# Patient Record
Sex: Male | Born: 1960 | Race: White | Hispanic: No | Marital: Married
Health system: Southern US, Community
[De-identification: ages and names within clinical notes are randomized; demographics above are authoritative.]

## PROBLEM LIST (undated history)

## (undated) DIAGNOSIS — I2729 Other secondary pulmonary hypertension: Secondary | ICD-10-CM

## (undated) DIAGNOSIS — R931 Abnormal findings on diagnostic imaging of heart and coronary circulation: Secondary | ICD-10-CM

## (undated) DIAGNOSIS — G5600 Carpal tunnel syndrome, unspecified upper limb: Secondary | ICD-10-CM

## (undated) DIAGNOSIS — I10 Essential (primary) hypertension: Secondary | ICD-10-CM

## (undated) DIAGNOSIS — D1803 Hemangioma of intra-abdominal structures: Secondary | ICD-10-CM

## (undated) DIAGNOSIS — M349 Systemic sclerosis, unspecified: Secondary | ICD-10-CM

## (undated) DIAGNOSIS — N200 Calculus of kidney: Secondary | ICD-10-CM

## (undated) DIAGNOSIS — I7300 Raynaud's syndrome without gangrene: Secondary | ICD-10-CM

## (undated) DIAGNOSIS — R911 Solitary pulmonary nodule: Secondary | ICD-10-CM

## (undated) DIAGNOSIS — K219 Gastro-esophageal reflux disease without esophagitis: Secondary | ICD-10-CM

## (undated) HISTORY — DX: Carpal tunnel syndrome, unspecified upper limb: G56.00

## (undated) HISTORY — DX: Calculus of kidney: N20.0

## (undated) HISTORY — DX: Gastro-esophageal reflux disease without esophagitis: K21.9

## (undated) HISTORY — DX: Abnormal findings on diagnostic imaging of heart and coronary circulation: R93.1

## (undated) HISTORY — DX: Hemangioma of intra-abdominal structures: D18.03

## (undated) HISTORY — DX: Essential (primary) hypertension: I10

## (undated) HISTORY — DX: Systemic sclerosis, unspecified: M34.9

## (undated) HISTORY — DX: Other secondary pulmonary hypertension: I27.29

## (undated) HISTORY — PX: APPENDECTOMY: SHX54

## (undated) HISTORY — DX: Raynaud's syndrome without gangrene: I73.00

## (undated) HISTORY — DX: Solitary pulmonary nodule: R91.1

---

## 1998-06-29 ENCOUNTER — Ambulatory Visit (HOSPITAL_BASED_OUTPATIENT_CLINIC_OR_DEPARTMENT_OTHER): Admission: RE | Admit: 1998-06-29 | Discharge: 1998-06-29 | Payer: Self-pay | Admitting: *Deleted

## 1999-06-22 ENCOUNTER — Ambulatory Visit (HOSPITAL_COMMUNITY): Admission: RE | Admit: 1999-06-22 | Discharge: 1999-06-22 | Payer: Self-pay | Admitting: Urology

## 1999-06-22 ENCOUNTER — Encounter: Payer: Self-pay | Admitting: Urology

## 2001-06-30 ENCOUNTER — Ambulatory Visit (HOSPITAL_COMMUNITY): Admission: RE | Admit: 2001-06-30 | Discharge: 2001-06-30 | Payer: Self-pay | Admitting: Rheumatology

## 2002-02-10 ENCOUNTER — Ambulatory Visit (HOSPITAL_COMMUNITY): Admission: RE | Admit: 2002-02-10 | Discharge: 2002-02-10 | Payer: Self-pay | Admitting: Rheumatology

## 2002-03-27 ENCOUNTER — Ambulatory Visit (HOSPITAL_COMMUNITY): Admission: RE | Admit: 2002-03-27 | Discharge: 2002-03-27 | Payer: Self-pay | Admitting: Rheumatology

## 2002-08-14 ENCOUNTER — Ambulatory Visit (HOSPITAL_COMMUNITY): Admission: RE | Admit: 2002-08-14 | Discharge: 2002-08-14 | Payer: Self-pay | Admitting: Rheumatology

## 2003-05-03 ENCOUNTER — Ambulatory Visit (HOSPITAL_COMMUNITY): Admission: RE | Admit: 2003-05-03 | Discharge: 2003-05-03 | Payer: Self-pay | Admitting: Rheumatology

## 2004-05-29 ENCOUNTER — Ambulatory Visit (HOSPITAL_COMMUNITY): Admission: RE | Admit: 2004-05-29 | Discharge: 2004-05-29 | Payer: Self-pay | Admitting: Rheumatology

## 2005-01-22 ENCOUNTER — Ambulatory Visit (HOSPITAL_COMMUNITY): Admission: RE | Admit: 2005-01-22 | Discharge: 2005-01-22 | Payer: Self-pay | Admitting: Internal Medicine

## 2005-02-02 ENCOUNTER — Ambulatory Visit (HOSPITAL_COMMUNITY): Admission: RE | Admit: 2005-02-02 | Discharge: 2005-02-02 | Payer: Self-pay | Admitting: Internal Medicine

## 2005-08-20 ENCOUNTER — Ambulatory Visit (HOSPITAL_COMMUNITY): Admission: RE | Admit: 2005-08-20 | Discharge: 2005-08-20 | Payer: Self-pay | Admitting: Rheumatology

## 2006-02-20 ENCOUNTER — Encounter: Admission: RE | Admit: 2006-02-20 | Discharge: 2006-02-20 | Payer: Self-pay | Admitting: Internal Medicine

## 2006-08-31 IMAGING — CT CT ANGIO CHEST
4 of 9 series · 17 of 31 positions shown · IV contrast (120 ML OMNI 300)
Comparison: None.

CLINICAL DATA: Evaluate for aortic aneurysm. 
 CT ANGIOGRAPHY OF CHEST:
TECHNIQUE: Multidetector CT imaging of the chest was performed during bolus injection of intravenous contrast.  Multiplanar CT angiographic image reconstructions were generated to evaluate the vascular anatomy.
 Contrast:  120 cc Omnipaque 300.

[Series 4: aortic disection · axial · 0.70mm/px · z∈[-270,-162]mm · 3 of 129 slices shown (1 of 2)]
[im 43/129  lung]
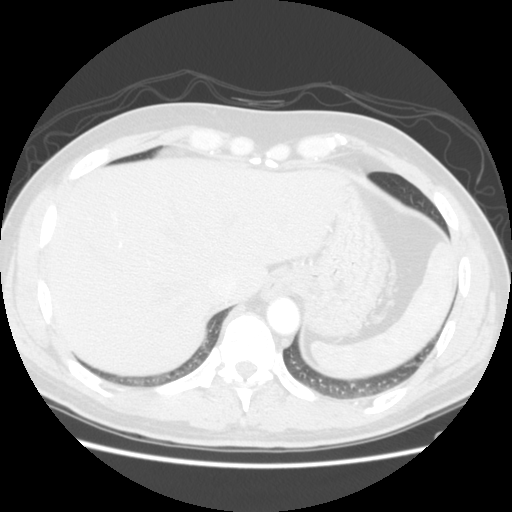
[im 65/129  lung]
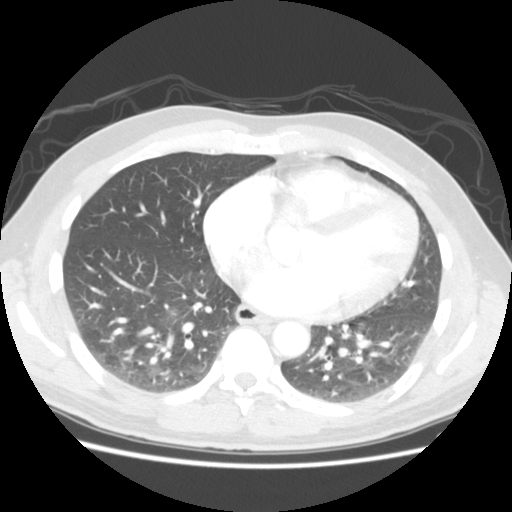
[im 86/129  lung]
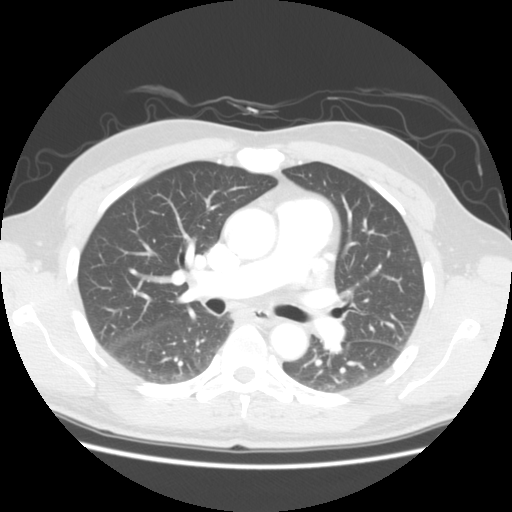

[Series 104: aortic disection · axial · 0.70mm/px · z∈[-336,-96]mm · 8 of 258 slices shown (2 of 2)]
[im 33/258  lung]
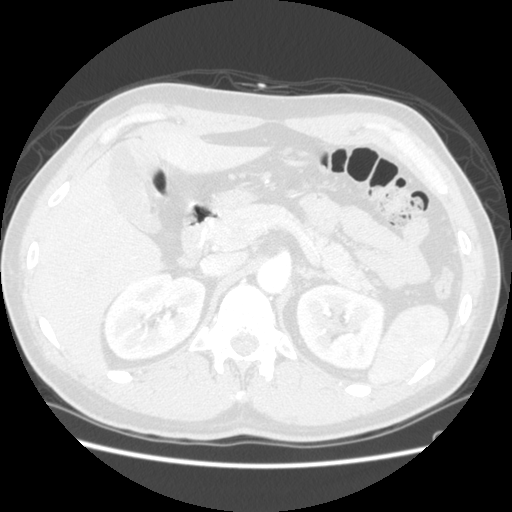
[im 65/258  mediastinal]
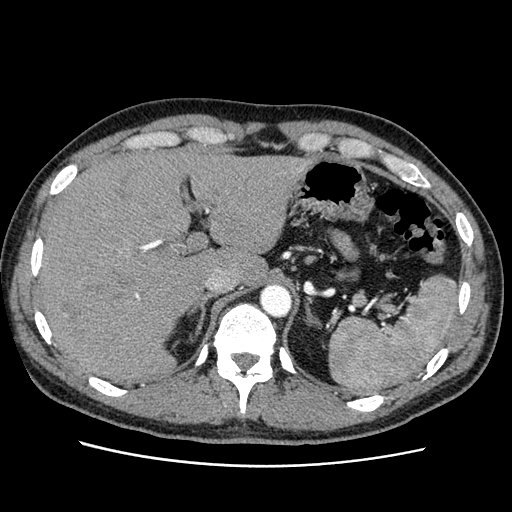
[im 97/258  lung]
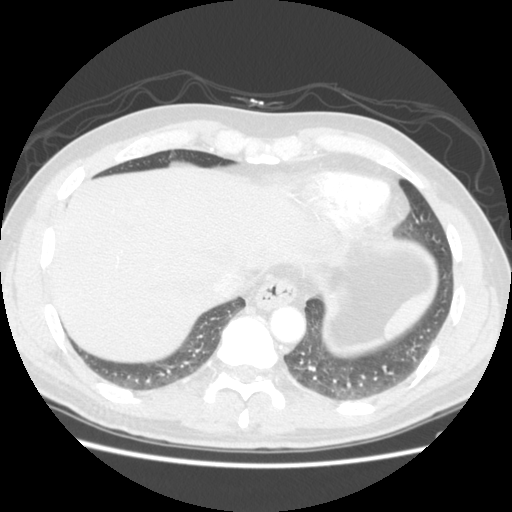
[im 129/258  mediastinal]
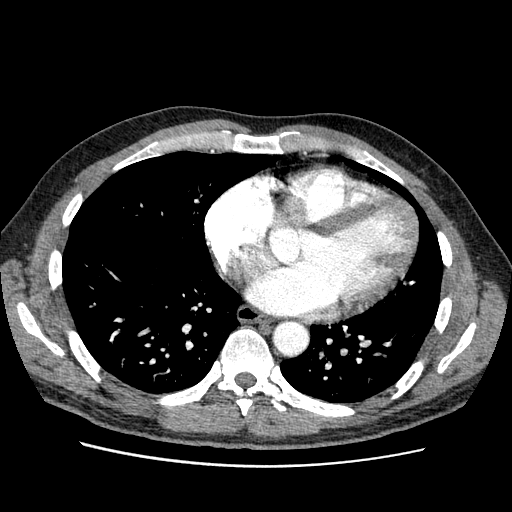
[im 130/258  lung]
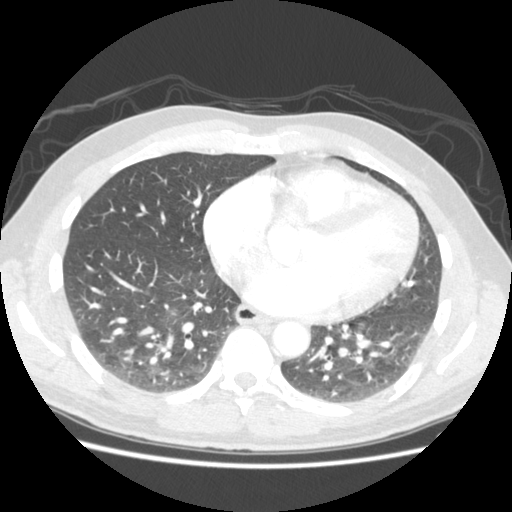
[im 161/258  mediastinal]
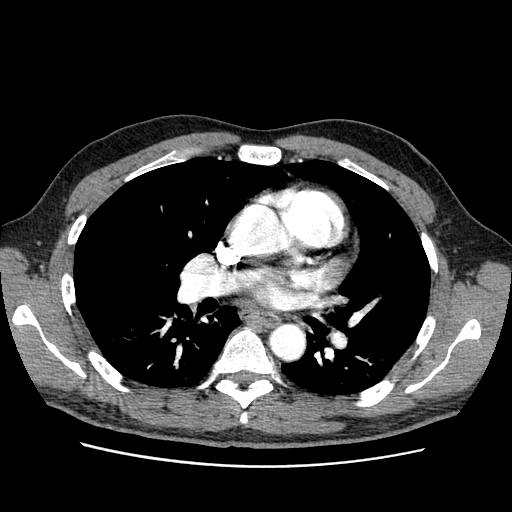
[im 193/258  lung]
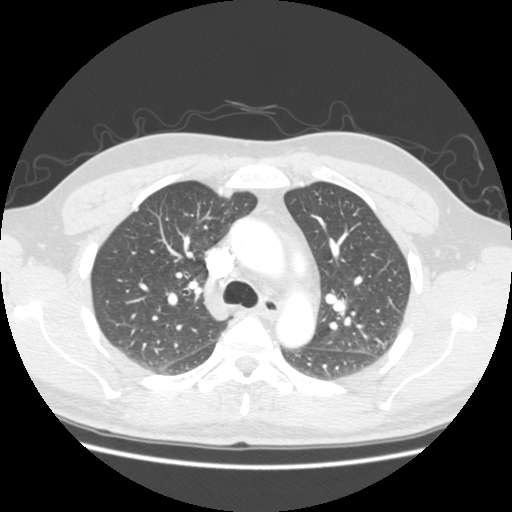
[im 225/258  mediastinal]
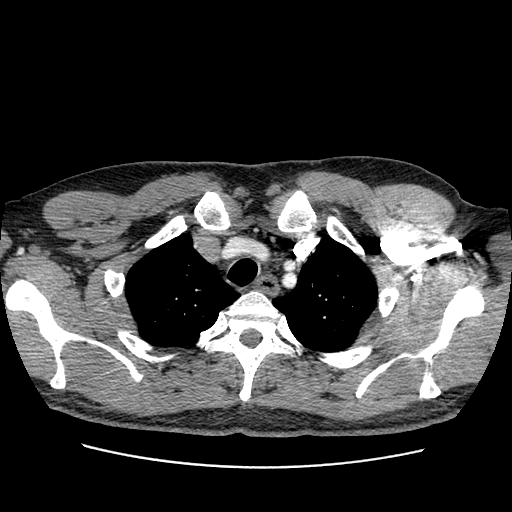

[Series 105: reformatted · sagittal · 0.70mm/px · 4 of 150 slices shown (1 of 2)]
[im 30/150  lung]
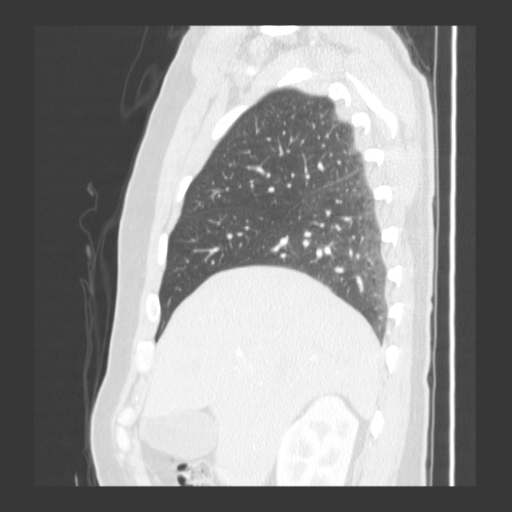
[im 60/150  lung]
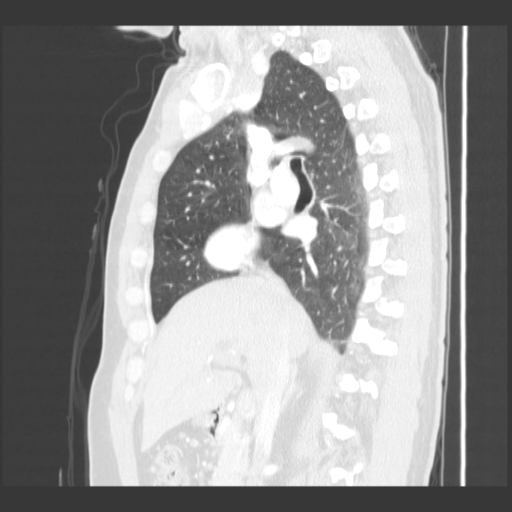
[im 90/150  lung]
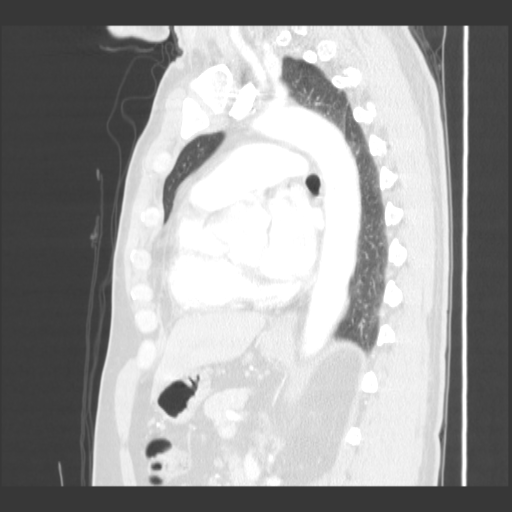
[im 120/150  lung]
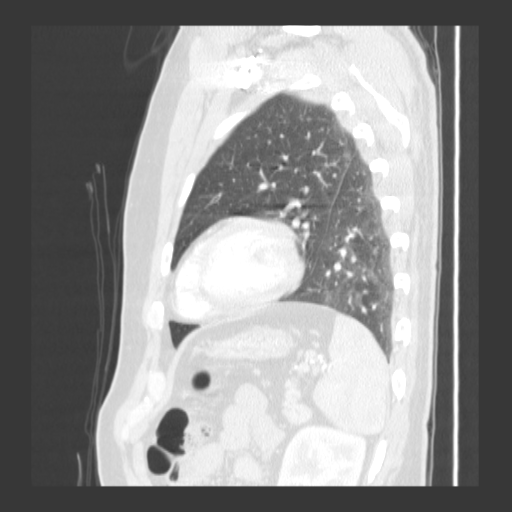

[Series 106: reformatted · coronal · 0.70mm/px · 2 of 110 slices shown (2 of 2)]
[im 37/110  lung]
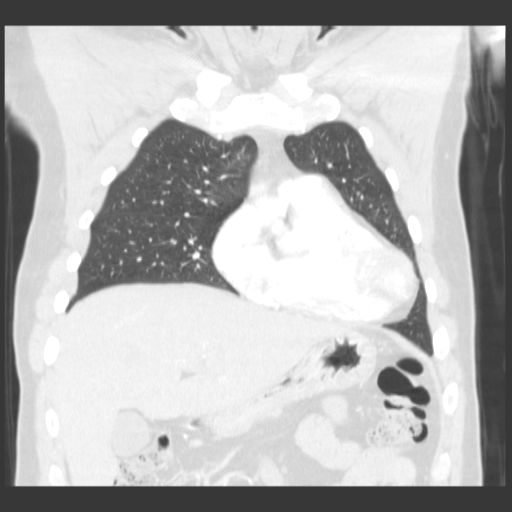
[im 73/110  lung]
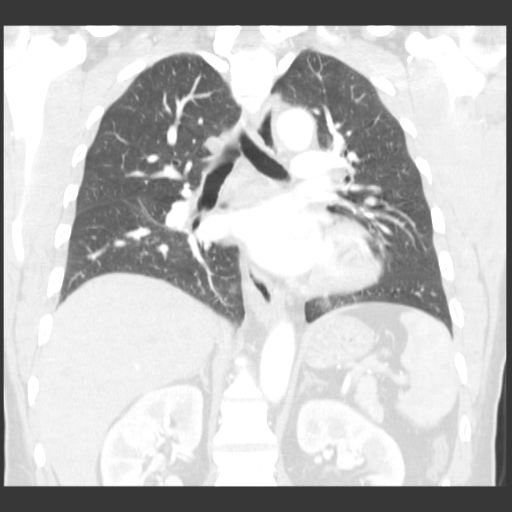

[17 of 31 positions shown; findings below may reference images not displayed]

FINDINGS: Tiny subpleural nodule within the right upper lobe measures 4.1 mm (image 34).  No additional suspicious pulmonary nodules or masses are noted. 
 There are no pathologically enlarged axillary lymph nodes.  No abnormally enlarged mediastinal or hilar lymph nodes. 
 The esophagus is somewhat flattened and dilated, consistent with the patient's history of scleroderma. 
 No mediastinal or hilar lymphadenopathy.  No pleural or pericardial effusions are noted. 
 The descending thoracic aorta is normal in caliber.  There is no evidence for dissection or aneurysm. 
 The ascending aorta is also normal.  This measures 3.5 cm in AP diameter and 3.8 cm in transverse diameter (image 46).  
 Review of the bone windows demonstrates no lytic or sclerotic lesions.  Focal area of low attenuation within the left lobe of the liver (image 90) measures fluid attenuation.  This measures 15.5 x 14.1 mm (image 90) and likely represents a simple cyst.  Within the right lobe of the liver, there is a vague hypodensity, which measures 15.6 x 9.5 mm.  This cannot be characterized as a simple cyst.  I would recommend further imaging with either three-phase CT of the liver or MRI.
 Small area of low attenuation within the inferior right lobe of the liver (image 112) measures 5.5 mm and is too small to reliably characterize but likely represents a simple cyst.  An additional area of low attenuation within the left lobe of the liver (image 103) measures 7.6 cm.  This is too small to reliably characterize but also likely represents a simple cyst.  The visualized portions of the pancreas are negative.
IMPRESSION: 1.  No evidence for an aortic aneurysm. 
 2.  Tiny subpleural nodule within the right upper lobe measures 5 mm.  One-year follow-up examination is recommended. 
 3.  Low-attenuation lesion within the right lobe of the liver, not characteristic of a benign liver cyst.  This may represent either a benign or malignant liver lesion, and further imaging with three-phase CT of the liver or MRI is recommended.

## 2007-02-17 ENCOUNTER — Ambulatory Visit (HOSPITAL_COMMUNITY): Admission: RE | Admit: 2007-02-17 | Discharge: 2007-02-17 | Payer: Self-pay | Admitting: Rheumatology

## 2007-08-13 ENCOUNTER — Ambulatory Visit (HOSPITAL_COMMUNITY): Admission: RE | Admit: 2007-08-13 | Discharge: 2007-08-13 | Payer: Self-pay | Admitting: Rheumatology

## 2010-03-18 ENCOUNTER — Encounter: Payer: Self-pay | Admitting: Internal Medicine

## 2011-01-17 ENCOUNTER — Other Ambulatory Visit (HOSPITAL_COMMUNITY): Payer: Self-pay | Admitting: Internal Medicine

## 2011-01-17 DIAGNOSIS — M349 Systemic sclerosis, unspecified: Secondary | ICD-10-CM

## 2011-01-26 ENCOUNTER — Other Ambulatory Visit (HOSPITAL_COMMUNITY): Payer: Self-pay | Admitting: Radiology

## 2011-01-26 ENCOUNTER — Inpatient Hospital Stay (HOSPITAL_COMMUNITY)
Admission: RE | Admit: 2011-01-26 | Discharge: 2011-01-26 | Disposition: A | Discharge status: Home / Self Care | Payer: Self-pay | Source: Ambulatory Visit

## 2011-01-26 ENCOUNTER — Ambulatory Visit (HOSPITAL_COMMUNITY)
Admission: RE | Admit: 2011-01-26 | Discharge: 2011-01-26 | Disposition: A | Discharge status: Home / Self Care | Payer: 59 | Source: Ambulatory Visit | Attending: Internal Medicine | Admitting: Internal Medicine

## 2011-01-26 DIAGNOSIS — M349 Systemic sclerosis, unspecified: Secondary | ICD-10-CM | POA: Insufficient documentation

## 2011-01-26 MED ORDER — ALBUTEROL SULFATE (5 MG/ML) 0.5% IN NEBU
2.5000 mg | INHALATION_SOLUTION | Freq: Once | RESPIRATORY_TRACT | Status: AC
  Administered 2011-01-26: 2.5 mg via RESPIRATORY_TRACT

## 2021-09-18 ENCOUNTER — Ambulatory Visit (INDEPENDENT_AMBULATORY_CARE_PROVIDER_SITE_OTHER): Payer: Managed Care, Other (non HMO) | Admitting: Cardiology

## 2021-09-18 VITALS — BP 120/80 | HR 63 | Ht 68.0 in | Wt 220.0 lb

## 2021-09-18 DIAGNOSIS — R931 Abnormal findings on diagnostic imaging of heart and coronary circulation: Secondary | ICD-10-CM | POA: Diagnosis not present

## 2021-09-18 DIAGNOSIS — Z8249 Family history of ischemic heart disease and other diseases of the circulatory system: Secondary | ICD-10-CM

## 2021-09-18 NOTE — Progress Notes (Signed)
Cardiology Office Note:    Date:  09/18/2021   ID:  Eric Wagner, DOB 10/07/1960, MRN [id number]  PCP:  Lavone Orn, MD   [facility] Providers Cardiologist:  [name], MD     Referring MD: [name], MD    History of Present Illness:    Eric Wagner is a 61 y.o. male here for assessment of elevated coronary calcium score at the request of Dr. Laurann Montana.   Father died in his late 59s and grandfather late 42 from myocardial infarction.  He comes in today with his calcium score performed in [place].  Left main was 0, LAD 338, left circumflex 154, right coronary artery to 39 for a total score of 731 which put him at 83rd percentile.  He also had a 4 cm ascending thoracic aorta as well as liver cyst of 4.3 cm. His MD called in Crestor '10mg'$ .   No red meat for years.   Walks 3-4 times a week. Golf. No CP, no SOB. Rare flutter like feeling.  Scleroderma, hands tingling seems new. Raynaud's.   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 Lives in [place].  Wife was present during discussion, [name]  Reviewed outside records, films.  Disc of calcium score.   Past Medical History:  Diagnosis Date   Carpal tunnel syndrome    Elevated coronary artery calcium score    GERD (gastroesophageal reflux disease)    HTN (hypertension)    Liver hemangioma    Lung nodule    Nephrolithiasis    Other secondary pulmonary hypertension (HCC)    Raynaud's syndrome without gangrene    Renal calculus, right    Systemic sclerosis (HCC)     Past Surgical History:  Procedure Laterality Date   APPENDECTOMY      Current Medications: Current Meds  Medication Sig   amLODipine-valsartan (EXFORGE) 5-160 MG tablet Take 1 tablet by mouth daily.   aspirin EC 81 MG tablet Take 81 mg by mouth daily. Swallow whole.   omeprazole (PRILOSEC) 40 MG capsule Take 40 mg by mouth daily.    rosuvastatin (CRESTOR) 10 MG tablet Take 10 mg by mouth daily.     Allergies:   Patient has no known allergies.   Social History   Socioeconomic History   Marital status: Married    Spouse name: Not on file   Number of children: Not on file   Years of education: Not on file   Highest education level: Not on file  Occupational History   Not on file  Tobacco Use   Smoking status: Not on file   Smokeless tobacco: Not on file  Substance and Sexual Activity   Alcohol use: Not on file   Drug use: Not on file   Sexual activity: Not on file  Other Topics Concern   Not on file  Social History Narrative   Not on file   Social Determinants of Health   Financial Resource Strain: Not on file  Food Insecurity: Not on file  Transportation Needs: Not on file  Physical Activity: Not on file  Stress: Not on file  Social Connections: Not on file     Family History: The patient's family history is not on file.  ROS:   Please see the history of present illness.     All other systems reviewed and are negative.  EKGs/Labs/Other Studies Reviewed:  The following studies were reviewed today: Calcium score as above took place on 08/17/2021  EKG:  EKG is  ordered today.  The ekg ordered today demonstrates sinus rhythm 63 no other abnormalities  Recent Labs: No results found for requested labs within last 365 days.  Recent Lipid Panel No results found for: "CHOL", "TRIG", "HDL", "CHOLHDL", "VLDL", "LDLCALC", "LDLDIRECT"   Risk Assessment/Calculations:              Physical Exam:    VS:  BP 120/80 (BP Location: Left Arm, Patient Position: Sitting, Cuff Size: Normal)   Pulse 63   Ht '5\' 8"'$  (1.727 m)   Wt 220 lb (99.8 kg)   SpO2 94%   BMI 33.45 kg/m     Wt Readings from Last 3 Encounters:  09/18/21 220 lb (99.8 kg)     GEN:  Well nourished, well developed in no acute distress HEENT: Normal NECK: No JVD; No carotid bruits LYMPHATICS: No lymphadenopathy CARDIAC: RRR,  no murmurs, no rubs, gallops RESPIRATORY:  Clear to auscultation without rales, wheezing or rhonchi  ABDOMEN: Soft, non-tender, non-distended MUSCULOSKELETAL:  No edema; No deformity  SKIN: Warm and dry NEUROLOGIC:  Alert and oriented x 3 PSYCHIATRIC:  Normal affect   ASSESSMENT:    1. Elevated coronary artery calcium score   2. Family history of coronary artery disease    PLAN:    In order of problems listed above:  Elevated coronary calcium score -83rd percentile 731.  Given his multivessel coronary artery calcification and score greater than 400, we will go ahead and proceed with cardiac PET to make sure that there is no evidence of flow-limiting coronary artery disease present. - Continue with Crestor 10 mg.  LDL goal will be less than 70.  Plaque stabilization discussed at length and the benefits.  Given the higher calcium score, there is increase cardiovascular risk.  Father and grandfather both had myocardial infarctions. - Agree with aspirin 81 mg.  Diet, exercise.  Mediterranean type diet.  Essential hypertension - Excellent control on Exforge.  No change in prescription drug management.  Scleroderma - Raynaud's type phenomenon.  Sometimes feels some tingling in his hands bilaterally.  This is new for him.  Question carpal tunnel/neuropathy.   Shared Decision Making/Informed Consent The risks [chest pain, shortness of breath, cardiac arrhythmias, dizziness, blood pressure fluctuations, myocardial infarction, stroke/transient ischemic attack, nausea, vomiting, allergic reaction, radiation exposure, metallic taste sensation and life-threatening complications (estimated to be 1 in 10,000)], benefits (risk stratification, diagnosing coronary artery disease, treatment guidance) and alternatives of a cardiac PET stress test were discussed in detail with Mr. [name] and he agrees to proceed.    Medication Adjustments/Labs and Tests Ordered: Current medicines are reviewed at length  with the patient today.  Concerns regarding medicines are outlined above.  Orders Placed This Encounter  Procedures   NM PET CT CARDIAC PERFUSION MULTI W/ABSOLUTE BLOODFLOW   Cardiac Stress Test: Informed Consent Details: Physician/Practitioner Attestation; Transcribe to consent form and obtain patient signature   EKG 12-Lead   No orders of the defined types were placed in this encounter.   Patient Instructions  Medication Instructions:  The current medical regimen is effective;  continue present plan and medications.  *If you need a refill on your cardiac medications before your next appointment, please call your pharmacy*  Testing/Procedures: How to Prepare for Your Cardiac PET/CT Stress Test:  1. Please do not take these medications before your test:   Medications that may interfere with the  cardiac pharmacological stress agent (ex. nitrates - including erectile dysfunction medications or beta-blockers) the day of the exam. (Erectile dysfunction medication should be held for at least 72 hrs prior to test) Theophylline containing medications for 12 hours. Dipyridamole 48 hours prior to the test. Your remaining medications may be taken with water.  2. Nothing to eat or drink, except water, 3 hours prior to arrival time.   NO caffeine/decaffeinated products, or chocolate 12 hours prior to arrival.  3. NO perfume, cologne or lotion  4. Total time is 1 to 2 hours; you may want to bring reading material for the waiting time.  5. Please report to Admitting at the [facility] Main Entrance 60 minutes early for your test.  [place], [place] [id number]  Diabetic Preparation:  Hold oral medications. You may take NPH and Lantus insulin. Do not take Humalog or Humulin R (Regular Insulin) the day of your test. Check blood sugars prior to leaving the house. If able to eat breakfast prior to 3 hour fasting, you may take all medications, including your  insulin, Do not worry if you miss your breakfast dose of insulin - start at your next meal.  IF YOU THINK YOU MAY BE PREGNANT, OR ARE NURSING PLEASE INFORM THE TECHNOLOGIST.  In preparation for your appointment, medication and supplies will be purchased.  Appointment availability is limited, so if you need to cancel or reschedule, please call the Radiology Department at [phone]  24 hours in advance to avoid a cancellation fee of $100.00  What to Expect After you Arrive:  Once you arrive and check in for your appointment, you will be taken to a preparation room within the Radiology Department.  A technologist or Nurse will obtain your medical history, verify that you are correctly prepped for the exam, and explain the procedure.  Afterwards,  an IV will be started in your arm and electrodes will be placed on your skin for EKG monitoring during the stress portion of the exam. Then you will be escorted to the PET/CT scanner.  There, staff will get you positioned on the scanner and obtain a blood pressure and EKG.  During the exam, you will continue to be connected to the EKG and blood pressure machines.  A small, safe amount of a radioactive tracer will be injected in your IV to obtain a series of pictures of your heart along with an injection of a stress agent.    After your Exam:  It is recommended that you eat a meal and drink a caffeinated beverage to counter act any effects of the stress agent.  Drink plenty of fluids for the remainder of the day and urinate frequently for the first couple of hours after the exam.  Your doctor will inform you of your test results within 7-10 business days.  For questions about your test or how to prepare for your test, please call: Marchia Bond, Cardiac Imaging Nurse Navigator  Gordy Clement, Cardiac Imaging Nurse Navigator Office: [phone]  Follow-Up: At [facility], you and your health needs are our priority.  As part of our continuing mission  to provide you with exceptional heart care, we have created designated Provider Care Teams.  These Care Teams include your primary Cardiologist (physician) and Advanced Practice Providers (APPs -  Physician Assistants and Nurse Practitioners) who all work together to provide you with the care you need, when you need it.  We recommend signing up for the patient portal called "  MyChart".  Sign up information is provided on this After Visit Summary.  MyChart is used to connect with patients for Virtual Visits (Telemedicine).  Patients are able to view lab/test results, encounter notes, upcoming appointments, etc.  Non-urgent messages can be sent to your provider as well.   To learn more about what you can do with MyChart, go to [email or web address].    Your next appointment:   6 month(s)  The format for your next appointment:   In Person  Provider:   [name], MD {   Important Information About Sugar         Signed, [name], MD  09/18/2021 9:47 AM    [place]

## 2021-09-18 NOTE — Patient Instructions (Addendum)
Medication Instructions:  The current medical regimen is effective;  continue present plan and medications.  *If you need a refill on your cardiac medications before your next appointment, please call your pharmacy*  Testing/Procedures: How to Prepare for Your Cardiac PET/CT Stress Test:  1. Please do not take these medications before your test:   Medications that may interfere with the cardiac pharmacological stress agent (ex. nitrates - including erectile dysfunction medications or beta-blockers) the day of the exam. (Erectile dysfunction medication should be held for at least 72 hrs prior to test) Theophylline containing medications for 12 hours. Dipyridamole 48 hours prior to the test. Your remaining medications may be taken with water.  2. Nothing to eat or drink, except water, 3 hours prior to arrival time.   NO caffeine/decaffeinated products, or chocolate 12 hours prior to arrival.  3. NO perfume, cologne or lotion  4. Total time is 1 to 2 hours; you may want to bring reading material for the waiting time.  5. Please report to Admitting at the [facility] Main Entrance 60 minutes early for your test.  [place], [place] [address]  Diabetic Preparation:  Hold oral medications. You may take NPH and Lantus insulin. Do not take Humalog or Humulin R (Regular Insulin) the day of your test. Check blood sugars prior to leaving the house. If able to eat breakfast prior to 3 hour fasting, you may take all medications, including your insulin, Do not worry if you miss your breakfast dose of insulin - start at your next meal.  IF YOU THINK YOU MAY BE PREGNANT, OR ARE NURSING PLEASE INFORM THE TECHNOLOGIST.  In preparation for your appointment, medication and supplies will be purchased.  Appointment availability is limited, so if you need to cancel or reschedule, please call the Radiology Department at [phone]  24 hours in advance to avoid a cancellation  fee of $100.00  What to Expect After you Arrive:  Once you arrive and check in for your appointment, you will be taken to a preparation room within the Radiology Department.  A technologist or Nurse will obtain your medical history, verify that you are correctly prepped for the exam, and explain the procedure.  Afterwards,  an IV will be started in your arm and electrodes will be placed on your skin for EKG monitoring during the stress portion of the exam. Then you will be escorted to the PET/CT scanner.  There, staff will get you positioned on the scanner and obtain a blood pressure and EKG.  During the exam, you will continue to be connected to the EKG and blood pressure machines.  A small, safe amount of a radioactive tracer will be injected in your IV to obtain a series of pictures of your heart along with an injection of a stress agent.    After your Exam:  It is recommended that you eat a meal and drink a caffeinated beverage to counter act any effects of the stress agent.  Drink plenty of fluids for the remainder of the day and urinate frequently for the first couple of hours after the exam.  Your doctor will inform you of your test results within 7-10 business days.  For questions about your test or how to prepare for your test, please call: Marchia Bond, Cardiac Imaging Nurse Navigator  Gordy Clement, Cardiac Imaging Nurse Navigator Office: [phone]  Follow-Up: At [facility], you and your health needs are our priority.  As part of our continuing  mission to provide you with exceptional heart care, we have created designated Provider Care Teams.  These Care Teams include your primary Cardiologist (physician) and Advanced Practice Providers (APPs -  Physician Assistants and Nurse Practitioners) who all work together to provide you with the care you need, when you need it.  We recommend signing up for the patient portal called "MyChart".  Sign up information is provided on this After  Visit Summary.  MyChart is used to connect with patients for Virtual Visits (Telemedicine).  Patients are able to view lab/test results, encounter notes, upcoming appointments, etc.  Non-urgent messages can be sent to your provider as well.   To learn more about what you can do with MyChart, go to [email or web address].    Your next appointment:   6 month(s)  The format for your next appointment:   In Person  Provider:   [name], MD {   Important Information About Sugar

## 2021-11-28 ENCOUNTER — Telehealth: Payer: Self-pay | Admitting: Cardiology

## 2021-11-28 NOTE — Telephone Encounter (Signed)
Spoke with wife, [name] (OK per [facility]) who is asking about precert for upcoming cardiac PET scan.  Advised when test is ordered a notification is sent to our precert department.  From my understanding the cardiac PET scans are not scheduled until precert has been obtained.  [name] reports the person who called her told her they may receive a call as late as Friday or Monday prior to the appt on Tuesday if precert has not been obtained.  Advised I am unaware of that however per protocol the office staff were instructed testing is not scheduled if precert has not been obtained.  Wife states understanding and will await any further notification from that department if there are any issues regarding the precert.  She was appreciative of the call back and information.

## 2021-11-28 NOTE — Telephone Encounter (Signed)
Wife called stating she would like a call back to discuss questions she has regarding upcoming PET CT test.

## 2021-12-01 ENCOUNTER — Telehealth: Payer: Self-pay | Admitting: *Deleted

## 2021-12-01 DIAGNOSIS — Z8249 Family history of ischemic heart disease and other diseases of the circulatory system: Secondary | ICD-10-CM

## 2021-12-01 DIAGNOSIS — R931 Abnormal findings on diagnostic imaging of heart and coronary circulation: Secondary | ICD-10-CM

## 2021-12-01 NOTE — Telephone Encounter (Signed)
Order placed for GXT and attestation to be signed by MD.  No caffeine/tobacco, food or drink 4 hours before.  Come prepared to exercise in 2 piece comfortable clothing and wear comfortable shoes.  Bring a list of your medications. Pt aware of insurance denial and need order for GXT.  Instructions reviewed.  All questions were answered at the time of the call.

## 2021-12-01 NOTE — Telephone Encounter (Signed)
-----   Message from [name], MD sent at 12/01/2021  2:28 PM EDT ----- Regarding: FW: AUTH DENIAL First denial that I've seen for PET.    OK to Change to ETT. Dx CAD [name], MD  ----- Message ----- From: [name] Sent: 12/01/2021   9:47 AM EDT To: [name], MD; Shellia Cleverly, RN; # Subject: Eric Wagner                                    Good morning,  [name] has denied Pet Scan for pt.. I sent clinicals from last OV but there is no other imaging results to send with an appeal. Following are the details of denial.  Based on [employer] Cardiac Imaging Guidelines Section(s): Stress Testing with Imaging - Indications (CD 1.4), we cannot approve this request. Your healthcare provider told us that there is a concern related to the blood vessels that carry blood to your heart. The request cannot be approved because: ? It must be needed because there is documentation that you have one of the following. -Severe obesity (such as a body mass index of 40 or higher). -Large breasts or implants. -Inability to exercise enough to reach a target heart rate due to a physical problem. ? You must have one of the following. -New, recurrent, or worsening signs or symptoms suggestive of decreased oxygen to your heart muscle (cardiac ischemia) along with a finding on a tracing of your heart's electrical activity (electrocardiogram or ECG) that could make it hard to tell if your heart is getting enough blood supply with exercise. -A finding on an ECG that could make it hard to tell if your heart is getting enough blood supply with exercise that is either new or has never been evaluated. -Other indication listed in this guideline.  If you are the requesting physician, and you would like to request a peer-to-peer conversation (available for medical necessity denials only), with an [employer] physician call our Health Services Department at [phone] to discuss this decision with a  physician reviewer using reference code [id number].  Thanks, [name]

## 2021-12-05 ENCOUNTER — Other Ambulatory Visit (HOSPITAL_COMMUNITY): Payer: Managed Care, Other (non HMO)

## 2021-12-05 ENCOUNTER — Ambulatory Visit (HOSPITAL_COMMUNITY): Admission: RE | Admit: 2021-12-05 | Payer: Managed Care, Other (non HMO) | Source: Ambulatory Visit

## 2021-12-13 ENCOUNTER — Other Ambulatory Visit: Payer: Self-pay | Admitting: Cardiology

## 2021-12-13 ENCOUNTER — Other Ambulatory Visit: Payer: Self-pay | Admitting: *Deleted

## 2021-12-13 DIAGNOSIS — I2510 Atherosclerotic heart disease of native coronary artery without angina pectoris: Secondary | ICD-10-CM

## 2021-12-14 ENCOUNTER — Ambulatory Visit: Payer: Managed Care, Other (non HMO) | Attending: Cardiology

## 2021-12-14 DIAGNOSIS — R931 Abnormal findings on diagnostic imaging of heart and coronary circulation: Secondary | ICD-10-CM | POA: Diagnosis not present

## 2021-12-14 DIAGNOSIS — Z8249 Family history of ischemic heart disease and other diseases of the circulatory system: Secondary | ICD-10-CM | POA: Diagnosis not present

## 2021-12-14 LAB — EXERCISE TOLERANCE TEST
Angina Index: 0
Duke Treadmill Score: 5
Estimated workload: 11.2
Exercise duration (min): 9 min
Exercise duration (sec): 40 s
MPHR: 159 {beats}/min
Peak HR: 139 {beats}/min
Percent HR: 87 %
Rest HR: 77 {beats}/min
ST Depression (mm): 1 mm

## 2022-01-16 ENCOUNTER — Telehealth: Payer: Self-pay | Admitting: Cardiology

## 2022-01-16 NOTE — Telephone Encounter (Signed)
Patient's wife called saying he just had a stress test done.  She is calling about the test the insurance denied. She would like to speak to the nurse about that test that was denied.

## 2022-01-16 NOTE — Telephone Encounter (Signed)
Spoke with [name] (wife on [place]) regarding the name of the testing that had been ordered and denied by insurance.  Advised Cardiac PET scan.  They are interested in seeing how much the cost would be out of pocket.  Pt is not having any complains of CP/SOB etc.  He has been taking Crestor as ordered and has been seeing a nutritionist to help with diet.  [name] is aware to have him f/u in January per Derl Barrow.  She will call back to schedule that appt at a later date.

## 2023-04-15 ENCOUNTER — Ambulatory Visit: Payer: Managed Care, Other (non HMO) | Attending: Cardiology | Admitting: Cardiology

## 2023-04-15 ENCOUNTER — Encounter: Payer: Self-pay | Admitting: Cardiology

## 2023-04-15 VITALS — BP 110/80 | HR 77 | Ht 68.0 in | Wt 227.0 lb

## 2023-04-15 DIAGNOSIS — E785 Hyperlipidemia, unspecified: Secondary | ICD-10-CM | POA: Diagnosis not present

## 2023-04-15 DIAGNOSIS — Z79899 Other long term (current) drug therapy: Secondary | ICD-10-CM | POA: Diagnosis not present

## 2023-04-15 DIAGNOSIS — I2510 Atherosclerotic heart disease of native coronary artery without angina pectoris: Secondary | ICD-10-CM | POA: Diagnosis not present

## 2023-04-15 DIAGNOSIS — R931 Abnormal findings on diagnostic imaging of heart and coronary circulation: Secondary | ICD-10-CM

## 2023-04-15 DIAGNOSIS — Z8249 Family history of ischemic heart disease and other diseases of the circulatory system: Secondary | ICD-10-CM

## 2023-04-15 NOTE — Patient Instructions (Signed)
Medication Instructions:  The current medical regimen is effective;  continue present plan and medications.  *If you need a refill on your cardiac medications before your next appointment, please call your pharmacy*   Lab Work: Please have blood work at [facility] ([facility]) If you have labs (blood work) drawn today and your tests are completely normal, you will receive your results only by: MyChart Message (if you have MyChart) OR A paper copy in the mail If you have any lab test that is abnormal or we need to change your treatment, we will call you to review the results.   Follow-Up: At [facility], you and your health needs are our priority.  As part of our continuing mission to provide you with exceptional heart care, we have created designated Provider Care Teams.  These Care Teams include your primary Cardiologist (physician) and Advanced Practice Providers (APPs -  Physician Assistants and Nurse Practitioners) who all work together to provide you with the care you need, when you need it.  We recommend signing up for the patient portal called "MyChart".  Sign up information is provided on this After Visit Summary.  MyChart is used to connect with patients for Virtual Visits (Telemedicine).  Patients are able to view lab/test results, encounter notes, upcoming appointments, etc.  Non-urgent messages can be sent to your provider as well.   To learn more about what you can do with MyChart, go to [email or web address].    Your next appointment:   1 year(s)  Provider:   Donato Schultz, MD

## 2023-04-15 NOTE — Progress Notes (Signed)
Cardiology Office Note:  .   Date:  04/15/2023  ID:  Eric Wagner, DOB [birth year], MRN [id number] PCP: Kirby Funk, MD (Inactive)  [facility] Providers Cardiologist:  Donato Schultz, MD     History of Present Illness: .   Eric Wagner is a 63 y.o. male Discussed with the use of AI scribe   History of Present Illness   Eric Wagner is a 63 year old male with scleroderma and coronary artery disease who presents for follow-up of elevated coronary calcium score.  He has an elevated coronary calcium score of 731, placing him in the 83rd percentile. He underwent an exercise tolerance test on December 14, 2021, exercising for 9 minutes and 40 seconds without ischemia. He is currently taking Crestor 10 mg daily and aspirin 81 mg daily. He has been less active over the past four months, attributing this to winter conditions.  He has a significant family history of coronary artery disease, with his father passing away at age 59 and his grandfather at age 29.  He has a history of scleroderma with Raynaud's phenomenon and experiences occasional tingling in his hands bilaterally. He is on amlodipine/valsartan 5/160 mg once daily for hypertension.  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 He enjoys playing golf and recently traveled to [place] and [place], where he walked extensively.            Studies Reviewed: Marland Kitchen   EKG Interpretation Date/Time:  Monday April 15 2023 16:19:08 EST Ventricular Rate:  77 PR Interval:  156 QRS Duration:  90 QT Interval:  380 QTC Calculation: 430 R Axis:   63  Text Interpretation: Sinus rhythm with Premature atrial complexes No previous ECGs available Confirmed by Donato Schultz ([id number]) on 04/15/2023 4:21:18 PM    Results   LABS LDL: 140 (06/11/2021)  RADIOLOGY Coronary calcium score: 731, 83rd  percentile  DIAGNOSTIC Exercise tolerance test: 9 minutes 40 seconds, no ischemia (12/14/2021) EKG: Normal     Risk Assessment/Calculations:            Physical Exam:   VS:  BP 110/80   Pulse 77   Ht 5\' 8"  (1.727 m)   Wt 227 lb (103 kg)   SpO2 96%   BMI 34.52 kg/m    Wt Readings from Last 3 Encounters:  04/15/23 227 lb (103 kg)  09/18/21 220 lb (99.8 kg)    GEN: Well nourished, well developed in no acute distress NECK: No JVD; No carotid bruits CARDIAC: RRR, no murmurs, no rubs, no gallops RESPIRATORY:  Clear to auscultation without rales, wheezing or rhonchi  ABDOMEN: Soft, non-tender, non-distended EXTREMITIES:  No edema; No deformity   ASSESSMENT AND PLAN: .    Assessment and Plan    Coronary Artery Disease with Elevated Coronary Calcium Score Coronary artery disease with a coronary calcium score of 731 (83rd percentile). Significant family history of coronary disease. Currently on rosuvastatin 10 mg daily and aspirin 81 mg daily. Last LDL was 140 mg/dL in April 2023, above the target of <70 mg/dL. Discussed need to lower LDL to reduce risk of further plaque formation and cardiovascular events. Suggested adding Repatha (PCSK9 inhibitor) if LDL remains elevated after next lipid panel. Repatha is an injectable taken biweekly, expected to lower LDL significantly. Discussed obtaining prior authorization for Repatha through insurance. - Order lipid panel and  LP(a) test - Initiate prior authorization for Repatha if LDL remains elevated - Consider adding Repatha if approved  Hyperlipidemia LDL of 140 mg/dL as of April 0981. Current treatment with rosuvastatin 10 mg daily is not achieving target LDL of <70 mg/dL. Discussed importance of aggressive lipid management due to coronary artery disease and family history. Potential need for additional lipid-lowering therapy based on upcoming lipid panel results. - Order lipid panel - Consider increasing rosuvastatin dose or adding  Repatha based on lipid panel results  Hypertension Hypertension well-controlled with amlodipine/valsartan 5/160 mg once daily. - Continue current antihypertensive regimen  Scleroderma with Raynaud's Phenomenon. No new symptoms or changes in condition discussed. - Continue current management  General Health Maintenance Advised to monitor weight, maintain a healthy diet with reduced carbohydrates, and stay active despite seasonal challenges. - Monitor weight - Maintain a healthy diet with reduced carbohydrates - Encourage regular physical activity  Follow-up - Follow up with lipid panel and LP(a) test results - Coordinate with pharmacy team for Repatha prior authorization if needed. LDL goal < 70 (even < 55 would be ideal)               Signed, Donato Schultz, MD

## 2023-04-23 LAB — LIPID PANEL
Chol/HDL Ratio: 3.1 {ratio} (ref 0.0–5.0)
Cholesterol, Total: 132 mg/dL (ref 100–199)
HDL: 42 mg/dL (ref >39)
LDL Chol Calc (NIH): 71 mg/dL (ref 0–99)
Triglycerides: 102 mg/dL (ref 0–149)
VLDL Cholesterol Cal: 19 mg/dL (ref 5–40)

## 2023-04-23 LAB — LIPOPROTEIN A (LPA)

## 2023-04-24 ENCOUNTER — Encounter: Payer: Self-pay | Admitting: Cardiology
